# Patient Record
Sex: Female | Born: 1970 | Race: Black or African American | Hispanic: No | Marital: Married | Smoking: Never smoker
Health system: Southern US, Community
[De-identification: ages and names within clinical notes are randomized; demographics above are authoritative.]

## PROBLEM LIST (undated history)

## (undated) DIAGNOSIS — D649 Anemia, unspecified: Secondary | ICD-10-CM

## (undated) DIAGNOSIS — D500 Iron deficiency anemia secondary to blood loss (chronic): Principal | ICD-10-CM

## (undated) HISTORY — DX: Iron deficiency anemia secondary to blood loss (chronic): D50.0

## (undated) HISTORY — DX: Anemia, unspecified: D64.9

---

## 2006-11-12 ENCOUNTER — Emergency Department (HOSPITAL_COMMUNITY): Admission: EM | Admit: 2006-11-12 | Discharge: 2006-11-12 | Payer: Self-pay | Admitting: Family Medicine

## 2006-11-16 ENCOUNTER — Emergency Department (HOSPITAL_COMMUNITY): Admission: EM | Admit: 2006-11-16 | Discharge: 2006-11-16 | Payer: Self-pay | Admitting: Family Medicine

## 2007-03-01 ENCOUNTER — Other Ambulatory Visit: Admission: RE | Admit: 2007-03-01 | Discharge: 2007-03-01 | Payer: Self-pay | Admitting: Gynecology

## 2007-04-16 ENCOUNTER — Emergency Department (HOSPITAL_COMMUNITY): Admission: EM | Admit: 2007-04-16 | Discharge: 2007-04-16 | Payer: Self-pay | Admitting: Family Medicine

## 2007-11-22 ENCOUNTER — Emergency Department (HOSPITAL_COMMUNITY): Admission: EM | Admit: 2007-11-22 | Discharge: 2007-11-22 | Payer: Self-pay | Admitting: Emergency Medicine

## 2008-04-29 ENCOUNTER — Emergency Department (HOSPITAL_COMMUNITY): Admission: EM | Admit: 2008-04-29 | Discharge: 2008-04-29 | Payer: Self-pay | Admitting: Family Medicine

## 2009-03-01 ENCOUNTER — Emergency Department (HOSPITAL_COMMUNITY): Admission: EM | Admit: 2009-03-01 | Discharge: 2009-03-01 | Payer: Self-pay | Admitting: Emergency Medicine

## 2009-04-05 ENCOUNTER — Emergency Department (HOSPITAL_COMMUNITY): Admission: EM | Admit: 2009-04-05 | Discharge: 2009-04-05 | Payer: Self-pay | Admitting: Emergency Medicine

## 2009-12-20 ENCOUNTER — Emergency Department (HOSPITAL_COMMUNITY): Admission: EM | Admit: 2009-12-20 | Discharge: 2009-12-20 | Payer: Self-pay | Admitting: Family Medicine

## 2010-01-10 ENCOUNTER — Encounter: Admission: RE | Admit: 2010-01-10 | Discharge: 2010-01-10 | Payer: Self-pay | Admitting: Gynecology

## 2010-11-25 ENCOUNTER — Inpatient Hospital Stay (INDEPENDENT_AMBULATORY_CARE_PROVIDER_SITE_OTHER)
Admission: RE | Admit: 2010-11-25 | Discharge: 2010-11-25 | Disposition: A | Discharge status: Home / Self Care | Payer: BC Managed Care – PPO | Source: Ambulatory Visit | Attending: Family Medicine | Admitting: Family Medicine

## 2010-11-25 DIAGNOSIS — J069 Acute upper respiratory infection, unspecified: Secondary | ICD-10-CM

## 2010-11-25 LAB — POCT URINALYSIS DIPSTICK
Ketones, ur: NEGATIVE mg/dL
Nitrite: NEGATIVE
Specific Gravity, Urine: 1.02 (ref 1.005–1.030)
Urine Glucose, Fasting: NEGATIVE mg/dL
Urobilinogen, UA: 0.2 mg/dL (ref 0.0–1.0)
pH: 6 (ref 5.0–8.0)

## 2011-07-16 LAB — POCT RAPID STREP A: Streptococcus, Group A Screen (Direct): NEGATIVE

## 2011-08-28 ENCOUNTER — Other Ambulatory Visit: Payer: Self-pay

## 2011-08-28 ENCOUNTER — Encounter: Payer: Self-pay | Admitting: Cardiology

## 2011-08-28 ENCOUNTER — Emergency Department (HOSPITAL_COMMUNITY)
Admission: EM | Admit: 2011-08-28 | Discharge: 2011-08-28 | Disposition: A | Discharge status: Home / Self Care | Payer: BC Managed Care – PPO | Source: Home / Self Care | Attending: Emergency Medicine | Admitting: Emergency Medicine

## 2011-08-28 DIAGNOSIS — E860 Dehydration: Secondary | ICD-10-CM

## 2011-08-28 DIAGNOSIS — R000 Tachycardia, unspecified: Secondary | ICD-10-CM

## 2011-08-28 DIAGNOSIS — I498 Other specified cardiac arrhythmias: Secondary | ICD-10-CM

## 2011-08-28 DIAGNOSIS — R110 Nausea: Secondary | ICD-10-CM

## 2011-08-28 LAB — POCT URINALYSIS DIP (DEVICE)
Bilirubin Urine: NEGATIVE
Glucose, UA: NEGATIVE mg/dL
Ketones, ur: 15 mg/dL — AB
Nitrite: NEGATIVE
Specific Gravity, Urine: 1.01 (ref 1.005–1.030)
Urobilinogen, UA: 0.2 mg/dL (ref 0.0–1.0)

## 2011-08-28 LAB — POCT I-STAT, CHEM 8
Chloride: 103 mEq/L (ref 96–112)
HCT: 43 % (ref 36.0–46.0)
Hemoglobin: 14.6 g/dL (ref 12.0–15.0)
Sodium: 136 mEq/L (ref 135–145)

## 2011-08-28 LAB — TSH: TSH: 2.789 u[IU]/mL (ref 0.350–4.500)

## 2011-08-28 MED ORDER — ONDANSETRON HCL 4 MG PO TABS: 4.0000 mg | ORAL_TABLET | Freq: Three times a day (TID) | ORAL | Status: AC | PRN

## 2011-08-28 NOTE — ED Notes (Signed)
Pt reports she has had a dry cough since Wednesday. This past Wednesday evening she had chills and body aches which continue today. Her body is tender to touch all over and she has nausea, no vomiting. Tolerating water. Last oral intake of solid food was 1230 yesterday and that consisted of a bowl of cereal.  Pt states she felt warm but did not take her temperature.

## 2011-08-28 NOTE — ED Provider Notes (Signed)
History     CSN: [id number] Arrival date & time: 08/28/2011 11:54 AM   First MD Initiated Contact with Patient 08/28/11 1324      Chief Complaint  Patient presents with  . Cough  . Generalized Body Aches  . Abdominal Pain    (Consider location/radiation/quality/duration/timing/severity/associated sxs/prior treatment) Patient is a 40 y.o. female presenting with cough and abdominal pain. The history is provided by the patient.  Cough This is a new problem. The problem occurs constantly. The cough is non-productive. There has been no fever. Associated symptoms include chills. Pertinent negatives include no chest pain, no weight loss, no headaches, no shortness of breath and no wheezing. Associated symptoms comments: Body aches and mild mausea.  Abdominal Pain The primary symptoms of the illness include abdominal pain. The primary symptoms of the illness do not include shortness of breath.  Additional symptoms associated with the illness include chills.    History reviewed. No pertinent past medical history.  Past Surgical History  Procedure Date  . Cesarean section 2006    History reviewed. No pertinent family history.  History  Substance Use Topics  . Smoking status: Never Smoker   . Smokeless tobacco: Not on file  . Alcohol Use: No    OB History    Grav Para Term Preterm Abortions TAB SAB Ect Mult Living                  Review of Systems  Constitutional: Positive for chills. Negative for weight loss.  Respiratory: Positive for cough. Negative for shortness of breath and wheezing.   Cardiovascular: Negative for chest pain.  Gastrointestinal: Positive for abdominal pain.  Neurological: Negative for headaches.    Allergies  Review of patient's allergies indicates no known allergies.  Home Medications   Current Outpatient Rx  Name Route Sig Dispense Refill  . IBUPROFEN 200 MG PO TABS Oral Take 400 mg by mouth every 6 (six) hours as needed.        BP 128/79   Pulse 112  Temp(Src) 98.8 F (37.1 C) (Tympanic)  Resp 16  SpO2 100%  LMP 08/10/2011  Physical Exam  ED Course  Procedures (including critical care time)  Labs Reviewed  POCT URINALYSIS DIP (DEVICE) - Abnormal; Notable for the following:    Ketones, ur 15 (*)    Leukocytes, UA SMALL (*) Biochemical Testing Only. Please order routine urinalysis from main lab if confirmatory testing is needed.   All other components within normal limits  POCT I-STAT, CHEM 8 - Abnormal; Notable for the following:    Calcium, Ion 1.11 (*)    All other components within normal limits  POCT PREGNANCY, URINE  POCT PREGNANCY, URINE  POCT URINALYSIS DIPSTICK  I-STAT, CHEM 8  TSH   No results found.   No diagnosis found.    MDM  Generalized symptoms- and sinus tachycardia        Jimmie Molly, MD 08/28/11 1428

## 2011-10-28 ENCOUNTER — Encounter (HOSPITAL_COMMUNITY): Payer: Self-pay | Admitting: Cardiology

## 2011-10-28 ENCOUNTER — Emergency Department (INDEPENDENT_AMBULATORY_CARE_PROVIDER_SITE_OTHER): Payer: BC Managed Care – PPO

## 2011-10-28 ENCOUNTER — Emergency Department (INDEPENDENT_AMBULATORY_CARE_PROVIDER_SITE_OTHER)
Admission: EM | Admit: 2011-10-28 | Discharge: 2011-10-28 | Disposition: A | Discharge status: Home / Self Care | Payer: BC Managed Care – PPO | Source: Home / Self Care | Attending: Emergency Medicine | Admitting: Emergency Medicine

## 2011-10-28 DIAGNOSIS — R6889 Other general symptoms and signs: Secondary | ICD-10-CM

## 2011-10-28 DIAGNOSIS — J111 Influenza due to unidentified influenza virus with other respiratory manifestations: Secondary | ICD-10-CM

## 2011-10-28 LAB — POCT RAPID STREP A: Streptococcus, Group A Screen (Direct): NEGATIVE

## 2011-10-28 MED ORDER — OSELTAMIVIR PHOSPHATE 75 MG PO CAPS: 75.0000 mg | ORAL_CAPSULE | Freq: Two times a day (BID) | ORAL | Status: AC

## 2011-10-28 MED ORDER — IBUPROFEN 800 MG PO TABS
ORAL_TABLET | ORAL | Status: AC
  Filled 2011-10-28: qty 1

## 2011-10-28 MED ORDER — TRAMADOL HCL 50 MG PO TABS: 100.0000 mg | ORAL_TABLET | Freq: Three times a day (TID) | ORAL | Status: DC | PRN

## 2011-10-28 MED ORDER — IBUPROFEN 800 MG PO TABS
800.0000 mg | ORAL_TABLET | Freq: Once | ORAL | Status: AC
  Administered 2011-10-28: 800 mg via ORAL

## 2011-10-28 MED ORDER — GUAIFENESIN-CODEINE 100-10 MG/5ML PO SYRP: 10.0000 mL | ORAL_SOLUTION | Freq: Four times a day (QID) | ORAL | Status: DC | PRN

## 2011-10-28 NOTE — ED Notes (Signed)
Pt reports cough, body aches, nasal congestion and fever since this past Monday.  Decreased PO intake.  Denies vomiting. Taking cough syrup with no relief. Pt has not taken tylenol or motrin. Reports chills on the inside and hot on the outside of her body.

## 2011-10-28 NOTE — ED Provider Notes (Signed)
Chief Complaint  Patient presents with  . Generalized Body Aches  . Sore Throat  . Fever  . Cough    History of Present Illness:  The patient is a 41 year old female with a three-day history which began with a dry cough and then progressed to myalgias, fever, chills, sweats, headaches, malaise, fatigue, wheezing, chest pain, sore throat, nasal congestion, rhinorrhea, and sneezing. She denies any abdominal pain, nausea, vomiting, or diarrhea. She has been exposed to a similar illness in several family members. She has not gotten the influenza vaccine this year.  Review of Systems:  Other than noted above, the patient denies any of the following symptoms. Systemic:  No fever, chills, sweats, fatigue, myalgias, headache, or anorexia. Eye:  No redness, pain or drainage. ENT:  No earache, nasal congestion, rhinorrhea, sinus pressure, or sore throat. Lungs:  No cough, sputum production, wheezing, shortness of breath. Or chest pain. GI:  No nausea, vomiting, abdominal pain or diarrhea. Skin:  No rash or itching.  PMFSH:  Past medical history, family history, social history, meds, and allergies were reviewed.  Physical Exam:   Vital signs:  BP 115/78  Pulse 120  Temp(Src) 101.4 F (38.6 C) (Oral)  Resp 20  SpO2 100%  LMP 10/05/2011 General:  Alert, in no distress. Eye:  No conjunctival injection or drainage. ENT:  TMs and canals were normal, without erythema or inflammation.  Nasal mucosa was clear and uncongested, without drainage.  Mucous membranes were moist.  Pharynx was clear, without exudate or drainage.  There were no oral ulcerations or lesions. Neck:  Supple, no adenopathy, tenderness or mass. Lungs:  No respiratory distress.  Lungs were clear to auscultation, without wheezes, rales or rhonchi.  Breath sounds were clear and equal bilaterally. Heart:  Regular rhythm, without gallops, murmers or rubs. Skin:  Clear, warm, and dry, without rash or lesions.  Labs:   Results for  orders placed during the hospital encounter of 10/28/11  POCT RAPID STREP A ([facility] ONLY)      Component Value Range   Streptococcus, Group A Screen (Direct) NEGATIVE  NEGATIVE      Radiology:  Dg Chest 2 View  10/28/2011  *RADIOLOGY REPORT*  Clinical Data: Cough and fever.  CHEST - 2 VIEW  Comparison: None available.  Findings: The heart size is normal.  The lungs are clear.  The visualized soft tissues and bony thorax are remarkable.  IMPRESSION: Negative chest.  Original Report Authenticated By: Jamesetta Orleans. MATTERN, M.D.   Medications given in [facility]:  Ibuprofen 800 mg by mouth.  Assessment:   Diagnoses that have been ruled out:  Strep throat, pneumonia.    Diagnoses that are still under consideration:  None  Final diagnoses:  Influenza-like illness     Plan:   1.  The following meds were prescribed:   New Prescriptions   GUAIFENESIN-CODEINE (GUIATUSS AC) 100-10 MG/5ML SYRUP    Take 10 mLs by mouth 4 (four) times daily as needed for cough.   OSELTAMIVIR (TAMIFLU) 75 MG CAPSULE    Take 1 capsule (75 mg total) by mouth every 12 (twelve) hours.   TRAMADOL (ULTRAM) 50 MG TABLET    Take 2 tablets (100 mg total) by mouth every 8 (eight) hours as needed for pain.   2.  The patient was instructed in symptomatic care and handouts were given. 3.  The patient was told to return if becoming worse in any way, if no better in 3 or 4 days, and given  some red flag symptoms that would indicate earlier return.   Roque Lias, MD 10/28/11 2103

## 2011-11-04 ENCOUNTER — Emergency Department (INDEPENDENT_AMBULATORY_CARE_PROVIDER_SITE_OTHER)
Admission: EM | Admit: 2011-11-04 | Discharge: 2011-11-04 | Disposition: A | Discharge status: Home / Self Care | Payer: BC Managed Care – PPO | Source: Home / Self Care | Attending: Family Medicine | Admitting: Family Medicine

## 2011-11-04 ENCOUNTER — Encounter (HOSPITAL_COMMUNITY): Payer: Self-pay | Admitting: *Deleted

## 2011-11-04 DIAGNOSIS — R05 Cough: Secondary | ICD-10-CM

## 2011-11-04 MED ORDER — PREDNISONE 20 MG PO TABS: ORAL_TABLET | ORAL | Status: AC

## 2011-11-04 MED ORDER — FEXOFENADINE-PSEUDOEPHED ER 60-120 MG PO TB12: 1.0000 | ORAL_TABLET | Freq: Two times a day (BID) | ORAL | Status: AC

## 2011-11-04 MED ORDER — HYDROCODONE-ACETAMINOPHEN 7.5-500 MG/15ML PO SOLN: 15.0000 mL | Freq: Three times a day (TID) | ORAL | Status: AC | PRN

## 2011-11-04 NOTE — ED Notes (Signed)
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

## 2011-11-06 NOTE — ED Provider Notes (Signed)
History     CSN: [id number]  Arrival date & time 11/04/11  1237   First MD Initiated Contact with Patient 11/04/11 1304      Chief Complaint  Patient presents with  . Cough  . Nasal Congestion    (Consider location/radiation/quality/duration/timing/severity/associated sxs/prior treatment) HPI Comments: 41 y/o female non smoker recently treated for influenza with oseltamivir reports feeling better in general but concerned about persistent cough, non productive. 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 Good appetite, No fever, no shortness of breath, no chest pain, no vomiting or diarrhea.   History reviewed. No pertinent past medical history.  Past Surgical History  Procedure Date  . Cesarean section 2006    History reviewed. No pertinent family history.  History  Substance Use Topics  . Smoking status: Never Smoker   . Smokeless tobacco: Not on file  . Alcohol Use: No    OB History    Grav Para Term Preterm Abortions TAB SAB Ect Mult Living                  Review of Systems  Constitutional: Negative for fever, chills and appetite change.  HENT: Negative for congestion, sore throat, rhinorrhea and sinus pressure.   Respiratory: Positive for cough. Negative for shortness of breath and wheezing.   Cardiovascular: Negative for chest pain and leg swelling.  Gastrointestinal: Negative for nausea, vomiting and abdominal pain.  Musculoskeletal: Negative for myalgias and arthralgias.  Skin: Negative for rash.  Neurological: Negative for headaches.    Allergies  Review of patient's allergies indicates no known allergies.  Home Medications   Current Outpatient Rx  Name Route Sig Dispense Refill  . IBUPROFEN 200 MG PO TABS Oral Take 400 mg by mouth every 6 (six) hours as needed.      Marland Kitchen FEXOFENADINE-PSEUDOEPHED ER 60-120 MG PO TB12 Oral Take 1 tablet by mouth every 12 (twelve) hours. 30  tablet 0  . HYDROCODONE-ACETAMINOPHEN 7.5-500 MG/15ML PO SOLN Oral Take 15 mLs by mouth every 8 (eight) hours as needed for pain or cough. 120 mL 0  . OSELTAMIVIR PHOSPHATE 75 MG PO CAPS Oral Take 1 capsule (75 mg total) by mouth every 12 (twelve) hours. 10 capsule 0  . PREDNISONE 20 MG PO TABS  2 tabs po daily for 5 days 10 tablet no    BP 117/82  Pulse 88  Temp(Src) 98.6 F (37 C) (Oral)  Resp 16  SpO2 96%  LMP 10/05/2011  Physical Exam  Nursing note and vitals reviewed. Constitutional: She is oriented to person, place, and time.  HENT:  Head: Normocephalic.  Nose: Nose normal.  Mouth/Throat: Oropharynx is clear and moist. No oropharyngeal exudate.  Eyes: Conjunctivae are normal. Pupils are equal, round, and reactive to light.  Neck: Neck supple. No JVD present.  Cardiovascular: Normal rate, regular rhythm and normal heart sounds.  Exam reveals no gallop and no friction rub.   No murmur heard. Pulmonary/Chest: Breath sounds normal. No respiratory distress. She has no wheezes. She has no rales. She exhibits no tenderness.  Lymphadenopathy:    She has no cervical adenopathy.  Neurological: She is alert and oriented to person, place, and time.  Skin: No rash noted.    ED Course  Procedures (including critical care time)  Labs Reviewed - No data to display No results found.   1. Cough, persistent       MDM  Clinically  well, normal lung exam. Likely upper airways hyperreactive after influenza. Prescribed prednisone, allegra-D and hydrocodone.         Sharin Grave, MD 11/06/11 1209

## 2012-03-01 IMAGING — CR DG CHEST 2V
2 series · 2 of 2 positions shown · non-contrast
Comparison: None available.

CLINICAL DATA: Cough and fever.

CHEST - 2 VIEW

[view not recorded (1 of 2)]
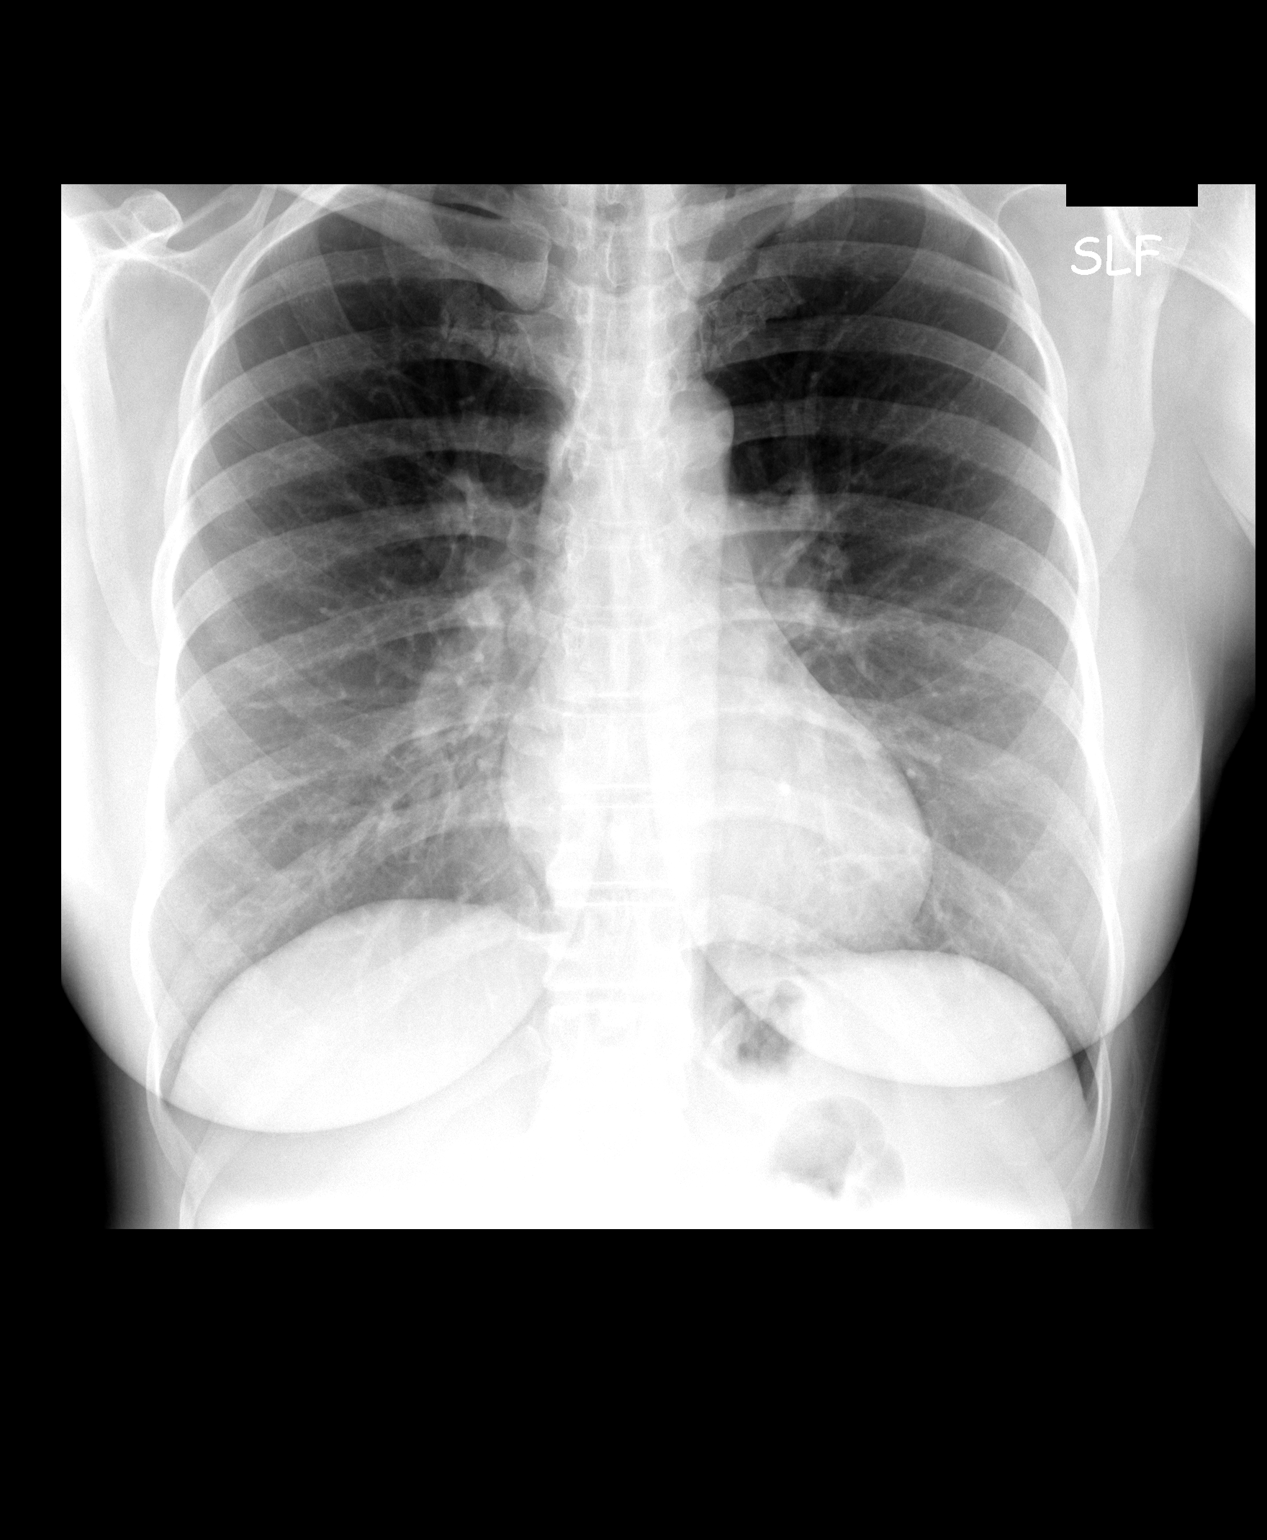

[view not recorded (2 of 2)]
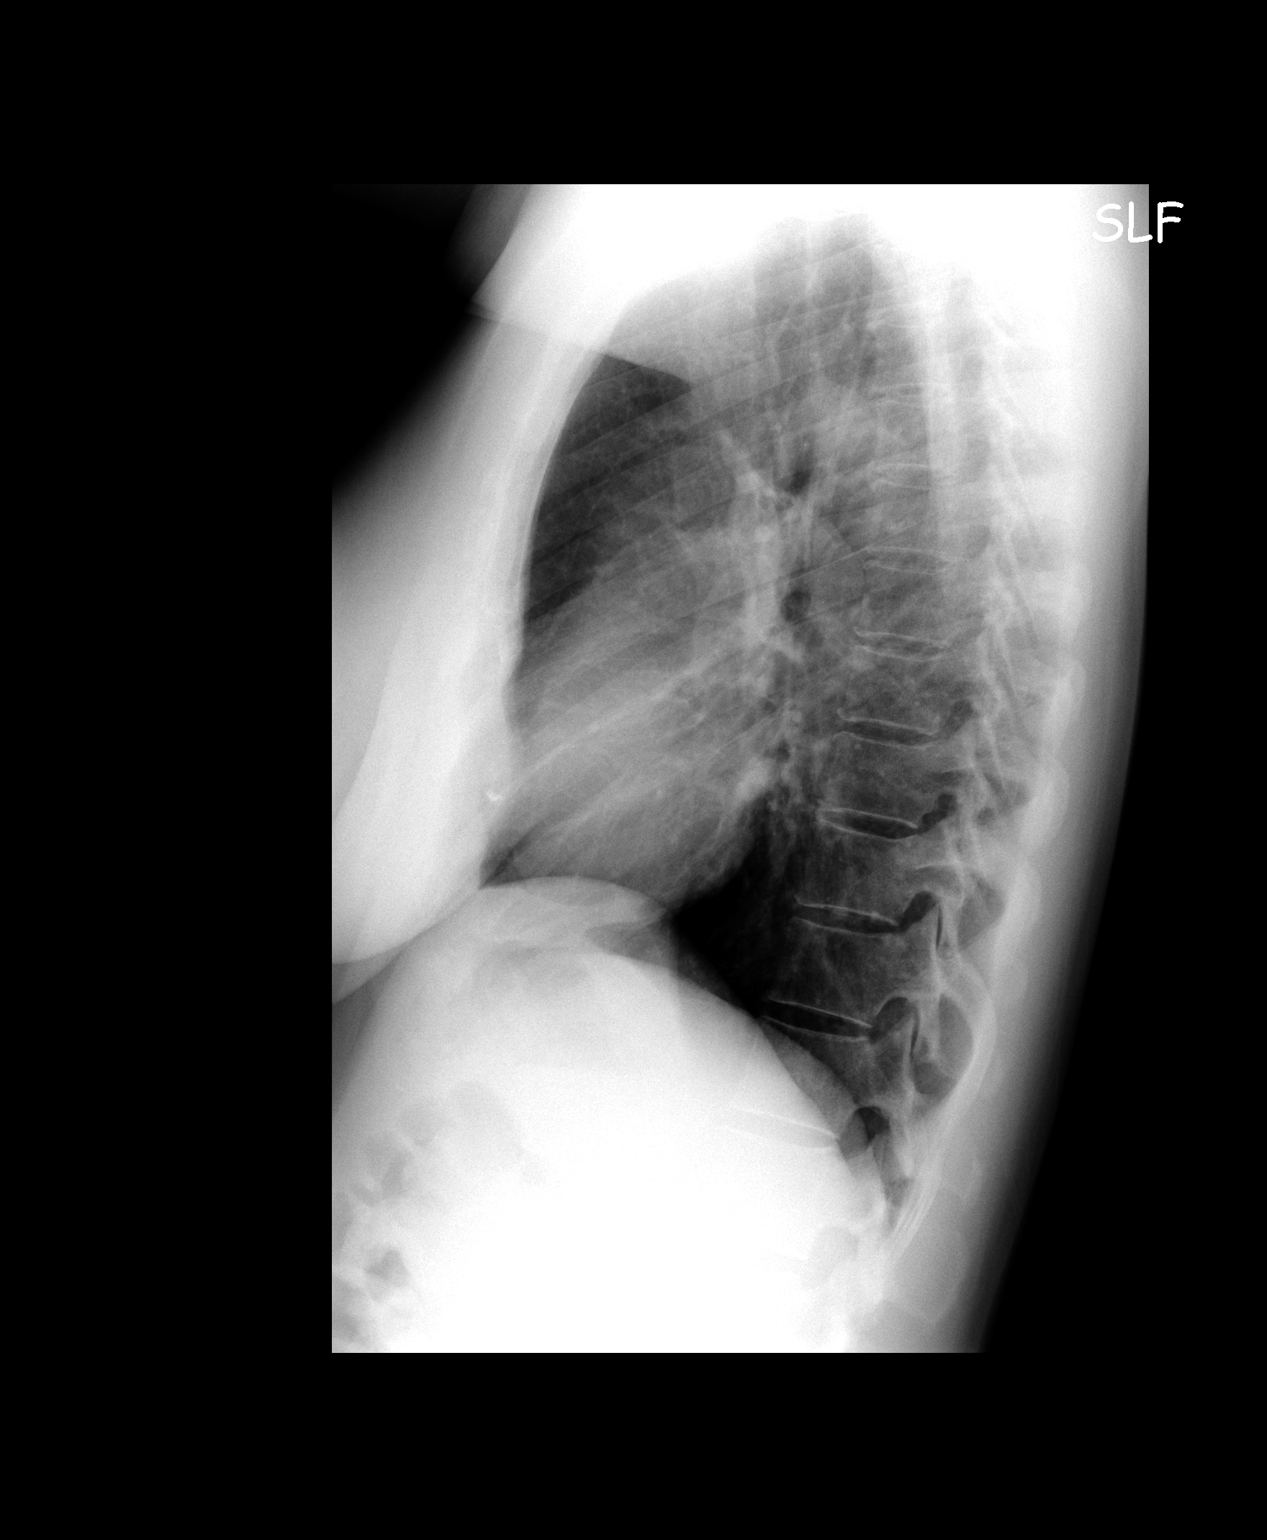

[2 of 2 positions shown; findings below may reference images not displayed]

FINDINGS: The heart size is normal.  The lungs are clear.  The
visualized soft tissues and bony thorax are remarkable.
IMPRESSION: Negative chest.

## 2013-09-02 ENCOUNTER — Encounter (HOSPITAL_COMMUNITY): Payer: Self-pay | Admitting: Emergency Medicine

## 2013-09-02 ENCOUNTER — Emergency Department (INDEPENDENT_AMBULATORY_CARE_PROVIDER_SITE_OTHER)
Admission: EM | Admit: 2013-09-02 | Discharge: 2013-09-02 | Disposition: A | Discharge status: Home / Self Care | Payer: BC Managed Care – PPO | Source: Home / Self Care | Attending: Family Medicine | Admitting: Family Medicine

## 2013-09-02 DIAGNOSIS — R05 Cough: Secondary | ICD-10-CM

## 2013-09-02 MED ORDER — GUAIFENESIN-CODEINE 100-10 MG/5ML PO SOLN: 5.0000 mL | Freq: Every evening | ORAL | Status: DC | PRN

## 2013-09-02 MED ORDER — AZITHROMYCIN 250 MG PO TABS: 250.0000 mg | ORAL_TABLET | Freq: Every day | ORAL | Status: DC

## 2013-09-02 MED ORDER — IPRATROPIUM BROMIDE 0.06 % NA SOLN: 2.0000 | Freq: Four times a day (QID) | NASAL | Status: DC

## 2013-09-02 NOTE — ED Notes (Signed)
Pt c/o cold sxs onset Monday Sxs include: Dry cough, HA, BA Denies: f/v/n/d Alert w/no signs of acute distress.

## 2013-09-02 NOTE — ED Provider Notes (Signed)
Leslie Flores is a 42 y.o. female who presents to Urgent Care today for 4 days of cough headache subjective fevers and chills. Patient has tried some over-the-counter medications which have helped a bit. No trouble breathing nausea vomiting diarrhea or chest pains. Patient feels well otherwise.   History reviewed. No pertinent past medical history. History  Substance Use Topics  . Smoking status: Never Smoker   . Smokeless tobacco: Not on file  . Alcohol Use: No   ROS as above Medications reviewed. No current facility-administered medications for this encounter.   Current Outpatient Prescriptions  Medication Sig Dispense Refill  . azithromycin (ZITHROMAX) 250 MG tablet Take 1 tablet (250 mg total) by mouth daily. Take first 2 tablets together, then 1 every day until finished.  6 tablet  0  . guaiFENesin-codeine 100-10 MG/5ML syrup Take 5 mLs by mouth at bedtime as needed for cough.  120 mL  0  . ibuprofen (ADVIL,MOTRIN) 200 MG tablet Take 400 mg by mouth every 6 (six) hours as needed.        Marland Kitchen ipratropium (ATROVENT) 0.06 % nasal spray Place 2 sprays into both nostrils 4 (four) times daily.  15 mL  1    Exam:  BP 121/76  Pulse 85  Temp(Src) 98.3 F (36.8 C) (Oral)  Resp 12  SpO2 100%  LMP 08/15/2013 Gen: Well NAD HEENT: EOMI,  MMM, posterior pharynx with cobblestoning. Tympanic membranes are normal appearing bilaterally Lungs: CTABL Nl WOB Heart: RRR no MRG Abd: NABS, NT, ND Exts: Non edematous BL  LE, warm and well perfused.   No results found for this or any previous visit (from the past 24 hour(s)). No results found.  Assessment and Plan: 42 y.o. female with bronchitis. Plan to treat with azithromycin Atrovent nasal spray and codeine containing cough medication. Discussed warning signs or symptoms. Please see discharge instructions. Patient expresses understanding.      Rodolph Bong, MD 09/02/13 [id number]

## 2014-06-14 ENCOUNTER — Ambulatory Visit: Payer: BC Managed Care – PPO

## 2014-06-14 ENCOUNTER — Ambulatory Visit: Payer: BC Managed Care – PPO | Admitting: Hematology and Oncology

## 2014-06-19 ENCOUNTER — Ambulatory Visit: Payer: BC Managed Care – PPO

## 2014-06-19 ENCOUNTER — Ambulatory Visit (HOSPITAL_BASED_OUTPATIENT_CLINIC_OR_DEPARTMENT_OTHER): Payer: BC Managed Care – PPO | Admitting: Hematology and Oncology

## 2014-06-19 ENCOUNTER — Telehealth: Payer: Self-pay | Admitting: Hematology and Oncology

## 2014-06-19 ENCOUNTER — Encounter: Payer: Self-pay | Admitting: Hematology and Oncology

## 2014-06-19 VITALS — BP 138/86 | HR 122 | Temp 98.3°F | Resp 20 | Ht 61.0 in | Wt 123.6 lb

## 2014-06-19 DIAGNOSIS — D500 Iron deficiency anemia secondary to blood loss (chronic): Secondary | ICD-10-CM

## 2014-06-19 DIAGNOSIS — N920 Excessive and frequent menstruation with regular cycle: Secondary | ICD-10-CM

## 2014-06-19 HISTORY — DX: Iron deficiency anemia secondary to blood loss (chronic): D50.0

## 2014-06-19 NOTE — Telephone Encounter (Signed)
gv pt appt schedule for oct - 10/1 lab. no rtn appt at this time per 9/1 pof.

## 2014-06-19 NOTE — Assessment & Plan Note (Signed)
This is related to menorrhagia. She has appointment to see her gynecologist for treatment. I recommend she start oral iron supplementation, to be increased to twice a day if tolerated. I will bring her back within a month to have her repeat iron studies and CBC rechecked. If she cannot tolerate oral iron supplements or have lack of response, I would proceed with intravenous iron infusion. We discussed some of the risks, benefits, and alternatives of intravenous iron infusions. The patient is symptomatic from anemia and the iron level is critically low. If She tolerates oral iron supplement poorly and desires to achieve higher levels of iron faster for adequate hematopoesis, we will proceed with IV iron. Some of the side-effects to be expected including risks of infusion reactions, phlebitis, headaches, nausea and fatigue.  The patient is willing to proceed. Goal is to keep ferritin level greater than 50

## 2014-06-19 NOTE — Progress Notes (Signed)
[facility] CONSULT NOTE  Patient Care Team: No Pcp Per Patient as PCP - General (General Practice) Artis Delay, MD as Consulting Physician (Hematology and Oncology) Janifer Adie, MD as Consulting Physician (Gynecology)  CHIEF COMPLAINTS/PURPOSE OF CONSULTATION:  Iron deficiency anemia  HISTORY OF PRESENTING ILLNESS:  Leslie Flores 43 y.o. female is here because of iron deficiency anemia  She was found to have abnormal CBC from recent blood work. On 05/31/2014, she was found to have anemia with hemoglobin of 10.7 with low MCV. She denies recent chest pain on exertion, shortness of breath on minimal exertion, pre-syncopal episodes, or palpitations. She complained of fatigue. She had not noticed any recent bleeding such as epistaxis, hematuria or hematochezia. She does have heavy menstruation, lasted 15 days with a cycle of 30 days. The patient denies over the counter NSAID ingestion. She is not on antiplatelets agents.  She had no prior history or diagnosis of cancer. Her age appropriate screening programs are up-to-date. She eats a variety of diet. She has excessive pica with chewing ice. She never donated blood or received blood transfusion The patient was not prescribed oral iron supplements  MEDICAL HISTORY:  Past Medical History  Diagnosis Date  . Anemia   . 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 Cesarean section  2006    SOCIAL HISTORY: History   Social History  . Marital Status: Married    Spouse Name: N/A    Number of Children: N/A  . Years of Education: N/A   Occupational History  . Not on file.   Social History Main Topics  . Smoking status: Never Smoker   . Smokeless tobacco: Never Used  . Alcohol Use: No  . Drug Use: No  . Sexual Activity: Not on file   Other Topics Concern  . Not on file   Social History Narrative  . No narrative  on file    FAMILY HISTORY: History reviewed. No pertinent family history.  ALLERGIES:  has No Known Allergies.  MEDICATIONS:  Current Outpatient Prescriptions  Medication Sig Dispense Refill  . Prenatal Vit-Fe Fumarate-FA (PRENATAL MULTIVITAMIN) TABS tablet Take 1 tablet by mouth daily at 12 noon.      Marland Kitchen ibuprofen (ADVIL,MOTRIN) 200 MG tablet Take 400 mg by mouth every 6 (six) hours as needed.         No current facility-administered medications for this visit.    REVIEW OF SYSTEMS:   Constitutional: Denies fevers, chills or abnormal night sweats Eyes: Denies blurriness of vision, double vision or watery eyes Ears, nose, mouth, throat, and face: Denies mucositis or sore throat Respiratory: Denies cough, dyspnea or wheezes Cardiovascular: Denies palpitation, chest discomfort or lower extremity swelling Gastrointestinal:  Denies nausea, heartburn or change in bowel habits Skin: Denies abnormal skin rashes Lymphatics: Denies new lymphadenopathy or easy bruising Neurological:Denies numbness, tingling or new weaknesses Behavioral/Psych: Mood is stable, no new changes  All other systems were reviewed with the patient and are negative.  PHYSICAL EXAMINATION: ECOG PERFORMANCE STATUS: 0 - Asymptomatic  Filed Vitals:   06/19/14 1346  BP: 138/86  Pulse: 122  Temp: 98.3 F (36.8 C)  Resp: 20   Filed Weights   06/19/14 1346  Weight: 123 lb 9.6 oz (56.065 kg)    GENERAL:alert, no distress and comfortable SKIN: skin color, texture, turgor are normal, no rashes or significant lesions EYES: normal, conjunctiva are pink and non-injected,  sclera clear OROPHARYNX:no exudate, no erythema and lips, buccal mucosa, and tongue normal  NECK: supple, thyroid normal size, non-tender, without nodularity LYMPH:  no palpable lymphadenopathy in the cervical, axillary or inguinal LUNGS: clear to auscultation and percussion with normal breathing effort HEART: regular rate & rhythm and no murmurs  and no lower extremity edema ABDOMEN:abdomen soft, non-tender and normal bowel sounds Musculoskeletal:no cyanosis of digits and no clubbing  PSYCH: alert & oriented x 3 with fluent speech NEURO: no focal motor/sensory deficits  LABORATORY DATA:  I have reviewed the data as listed ASSESSMENT & PLAN:  Iron deficiency anemia secondary to blood loss (chronic) This is related to menorrhagia. She has appointment to see her gynecologist for treatment. I recommend she start oral iron supplementation, to be increased to twice a day if tolerated. I will bring her back within a month to have her repeat iron studies and CBC rechecked. If she cannot tolerate oral iron supplements or have lack of response, I would proceed with intravenous iron infusion. We discussed some of the risks, benefits, and alternatives of intravenous iron infusions. The patient is symptomatic from anemia and the iron level is critically low. If She tolerates oral iron supplement poorly and desires to achieve higher levels of iron faster for adequate hematopoesis, we will proceed with IV iron. Some of the side-effects to be expected including risks of infusion reactions, phlebitis, headaches, nausea and fatigue.  The patient is willing to proceed. Goal is to keep ferritin level greater than 50   Menorrhagia She will proceed to seek help to control her periods.    All questions were answered. The patient knows to call the clinic with any problems, questions or concerns. I spent 40 minutes counseling the patient face to face. The total time spent in the appointment was 55 minutes and more than 50% was on counseling.     [facility], Maxamilian Amadon, MD 06/19/2014 8:36 PM

## 2014-06-19 NOTE — Assessment & Plan Note (Signed)
She will proceed to seek help to control her periods.

## 2014-07-19 ENCOUNTER — Other Ambulatory Visit: Payer: BC Managed Care – PPO

## 2014-08-03 ENCOUNTER — Other Ambulatory Visit: Payer: Self-pay | Admitting: *Deleted

## 2014-08-06 ENCOUNTER — Telehealth: Payer: Self-pay | Admitting: Hematology and Oncology

## 2014-08-06 NOTE — Telephone Encounter (Signed)
LFt msg for pt per 10/16 POF labs r/s n/s for 10/01...Marland Kitchen.Marland Kitchen.Marland Kitchen. KJ

## 2014-08-07 ENCOUNTER — Other Ambulatory Visit: Payer: BC Managed Care – PPO
